# Patient Record
Sex: Male | Born: 1995
Health system: Southern US, Community
[De-identification: ages and names within clinical notes are randomized; demographics above are authoritative.]

---

## 2019-07-20 ENCOUNTER — Observation Stay (HOSPITAL_COMMUNITY)
Admission: EM | Admit: 2019-07-20 | Discharge: 2019-07-20 | Disposition: A | Discharge status: Home / Self Care | Payer: BC Managed Care – PPO | Attending: General Surgery | Admitting: General Surgery

## 2019-07-20 ENCOUNTER — Encounter (HOSPITAL_COMMUNITY): Payer: Self-pay | Admitting: *Deleted

## 2019-07-20 ENCOUNTER — Emergency Department (HOSPITAL_COMMUNITY): Payer: BC Managed Care – PPO | Admitting: Anesthesiology

## 2019-07-20 ENCOUNTER — Emergency Department (HOSPITAL_COMMUNITY): Payer: BC Managed Care – PPO

## 2019-07-20 ENCOUNTER — Other Ambulatory Visit: Payer: Self-pay

## 2019-07-20 ENCOUNTER — Encounter (HOSPITAL_COMMUNITY)
Admission: EM | Disposition: A | Discharge status: Home / Self Care | Payer: Self-pay | Source: Home / Self Care | Attending: Emergency Medicine

## 2019-07-20 DIAGNOSIS — R109 Unspecified abdominal pain: Secondary | ICD-10-CM | POA: Diagnosis present

## 2019-07-20 DIAGNOSIS — F17200 Nicotine dependence, unspecified, uncomplicated: Secondary | ICD-10-CM | POA: Insufficient documentation

## 2019-07-20 DIAGNOSIS — K3580 Unspecified acute appendicitis: Secondary | ICD-10-CM | POA: Diagnosis not present

## 2019-07-20 DIAGNOSIS — K37 Unspecified appendicitis: Secondary | ICD-10-CM | POA: Diagnosis present

## 2019-07-20 DIAGNOSIS — Z20828 Contact with and (suspected) exposure to other viral communicable diseases: Secondary | ICD-10-CM | POA: Diagnosis not present

## 2019-07-20 HISTORY — PX: LAPAROSCOPIC APPENDECTOMY: SHX408

## 2019-07-20 LAB — COMPREHENSIVE METABOLIC PANEL
ALT: 20 U/L (ref 0–44)
AST: 17 U/L (ref 15–41)
Albumin: 4.7 g/dL (ref 3.5–5.0)
Alkaline Phosphatase: 54 U/L (ref 38–126)
Anion gap: 14 (ref 5–15)
BUN: 16 mg/dL (ref 6–20)
CO2: 21 mmol/L — ABNORMAL LOW (ref 22–32)
Calcium: 9.4 mg/dL (ref 8.9–10.3)
Chloride: 103 mmol/L (ref 98–111)
Creatinine, Ser: 0.75 mg/dL (ref 0.61–1.24)
GFR calc Af Amer: >60 mL/min (ref >60)
GFR calc non Af Amer: >60 mL/min (ref >60)
Glucose, Bld: 122 mg/dL — ABNORMAL HIGH (ref 70–99)
Potassium: 3.9 mmol/L (ref 3.5–5.1)
Sodium: 138 mmol/L (ref 135–145)
Total Bilirubin: 1.5 mg/dL — ABNORMAL HIGH (ref 0.3–1.2)
Total Protein: 7.8 g/dL (ref 6.5–8.1)

## 2019-07-20 LAB — URINALYSIS, ROUTINE W REFLEX MICROSCOPIC
Bilirubin Urine: NEGATIVE
Glucose, UA: NEGATIVE mg/dL
Hgb urine dipstick: NEGATIVE
Ketones, ur: 20 mg/dL — AB
Leukocytes,Ua: NEGATIVE
Nitrite: NEGATIVE
Protein, ur: NEGATIVE mg/dL
Specific Gravity, Urine: 1.039 — ABNORMAL HIGH (ref 1.005–1.030)
pH: 6 (ref 5.0–8.0)

## 2019-07-20 LAB — SARS CORONAVIRUS 2 BY RT PCR (HOSPITAL ORDER, PERFORMED IN ~~LOC~~ HOSPITAL LAB): SARS Coronavirus 2: NEGATIVE

## 2019-07-20 LAB — CBC
HCT: 47.2 % (ref 39.0–52.0)
Hemoglobin: 16.1 g/dL (ref 13.0–17.0)
MCH: 28.4 pg (ref 26.0–34.0)
MCHC: 34.1 g/dL (ref 30.0–36.0)
MCV: 83.4 fL (ref 80.0–100.0)
Platelets: 194 10*3/uL (ref 150–400)
RBC: 5.66 MIL/uL (ref 4.22–5.81)
RDW: 13.6 % (ref 11.5–15.5)
WBC: 19.2 10*3/uL — ABNORMAL HIGH (ref 4.0–10.5)
nRBC: 0 % (ref 0.0–0.2)

## 2019-07-20 LAB — LIPASE, BLOOD: Lipase: 33 U/L (ref 11–51)

## 2019-07-20 SURGERY — APPENDECTOMY, LAPAROSCOPIC
Anesthesia: General | Site: Abdomen

## 2019-07-20 MED ORDER — ENOXAPARIN SODIUM 40 MG/0.4ML ~~LOC~~ SOLN: 40.0000 mg | SUBCUTANEOUS | Status: DC

## 2019-07-20 MED ORDER — HYDROCODONE-ACETAMINOPHEN 5-325 MG PO TABS
1.0000 | ORAL_TABLET | ORAL | Status: DC | PRN
  Administered 2019-07-20: 1 via ORAL
  Administered 2019-07-20: 2 via ORAL
  Filled 2019-07-20 (×2): qty 2

## 2019-07-20 MED ORDER — MIDAZOLAM HCL 2 MG/2ML IJ SOLN
INTRAMUSCULAR | Status: AC
  Filled 2019-07-20: qty 2

## 2019-07-20 MED ORDER — FENTANYL CITRATE (PF) 250 MCG/5ML IJ SOLN
INTRAMUSCULAR | Status: AC
  Filled 2019-07-20: qty 5

## 2019-07-20 MED ORDER — SODIUM CHLORIDE 0.9 % IV SOLN
2.0000 g | Freq: Once | INTRAVENOUS | Status: AC
  Administered 2019-07-20: 2 g via INTRAVENOUS
  Filled 2019-07-20: qty 20

## 2019-07-20 MED ORDER — ONDANSETRON HCL 4 MG/2ML IJ SOLN
4.0000 mg | Freq: Once | INTRAMUSCULAR | Status: AC
  Administered 2019-07-20: 4 mg via INTRAVENOUS
  Filled 2019-07-20: qty 2

## 2019-07-20 MED ORDER — DEXAMETHASONE SODIUM PHOSPHATE 10 MG/ML IJ SOLN
INTRAMUSCULAR | Status: AC
  Filled 2019-07-20: qty 1

## 2019-07-20 MED ORDER — FENTANYL CITRATE (PF) 100 MCG/2ML IJ SOLN
50.0000 ug | Freq: Once | INTRAMUSCULAR | Status: AC
  Administered 2019-07-20: 50 ug via INTRAVENOUS
  Filled 2019-07-20: qty 2

## 2019-07-20 MED ORDER — BUPIVACAINE-EPINEPHRINE 0.5% -1:200000 IJ SOLN
INTRAMUSCULAR | Status: DC | PRN
  Administered 2019-07-20: 20 mL

## 2019-07-20 MED ORDER — ROCURONIUM BROMIDE 10 MG/ML (PF) SYRINGE
PREFILLED_SYRINGE | INTRAVENOUS | Status: DC | PRN
  Administered 2019-07-20: 40 mg via INTRAVENOUS
  Administered 2019-07-20: 10 mg via INTRAVENOUS

## 2019-07-20 MED ORDER — DIPHENHYDRAMINE HCL 50 MG/ML IJ SOLN: 25.0000 mg | Freq: Four times a day (QID) | INTRAMUSCULAR | Status: DC | PRN

## 2019-07-20 MED ORDER — BUPIVACAINE-EPINEPHRINE (PF) 0.25% -1:200000 IJ SOLN
INTRAMUSCULAR | Status: AC
  Filled 2019-07-20: qty 30

## 2019-07-20 MED ORDER — MIDAZOLAM HCL 5 MG/5ML IJ SOLN
INTRAMUSCULAR | Status: DC | PRN
  Administered 2019-07-20: 2 mg via INTRAVENOUS

## 2019-07-20 MED ORDER — BISACODYL 10 MG RE SUPP: 10.0000 mg | Freq: Every day | RECTAL | Status: DC | PRN

## 2019-07-20 MED ORDER — DIPHENHYDRAMINE HCL 25 MG PO CAPS: 25.0000 mg | ORAL_CAPSULE | Freq: Four times a day (QID) | ORAL | Status: DC | PRN

## 2019-07-20 MED ORDER — SODIUM CHLORIDE 0.9 % IV SOLN
INTRAVENOUS | Status: DC | PRN
  Administered 2019-07-20: 12:00:00 via INTRAVENOUS

## 2019-07-20 MED ORDER — MORPHINE SULFATE (PF) 4 MG/ML IV SOLN
4.0000 mg | Freq: Once | INTRAVENOUS | Status: AC
  Administered 2019-07-20: 09:00:00 4 mg via INTRAVENOUS
  Filled 2019-07-20: qty 1

## 2019-07-20 MED ORDER — SENNOSIDES-DOCUSATE SODIUM 8.6-50 MG PO TABS: 1.0000 | ORAL_TABLET | Freq: Every day | ORAL | Status: DC

## 2019-07-20 MED ORDER — ONDANSETRON HCL 4 MG/2ML IJ SOLN
INTRAMUSCULAR | Status: DC | PRN
  Administered 2019-07-20: 4 mg via INTRAVENOUS

## 2019-07-20 MED ORDER — ONDANSETRON HCL 4 MG/2ML IJ SOLN: 4.0000 mg | Freq: Four times a day (QID) | INTRAMUSCULAR | Status: DC | PRN

## 2019-07-20 MED ORDER — IOHEXOL 300 MG/ML  SOLN
100.0000 mL | Freq: Once | INTRAMUSCULAR | Status: AC | PRN
  Administered 2019-07-20: 100 mL via INTRAVENOUS

## 2019-07-20 MED ORDER — FENTANYL CITRATE (PF) 100 MCG/2ML IJ SOLN
INTRAMUSCULAR | Status: DC | PRN
  Administered 2019-07-20: 50 ug via INTRAVENOUS
  Administered 2019-07-20 (×2): 100 ug via INTRAVENOUS

## 2019-07-20 MED ORDER — SODIUM CHLORIDE 0.9% FLUSH
3.0000 mL | Freq: Once | INTRAVENOUS | Status: AC
  Administered 2019-07-20: 08:00:00 3 mL via INTRAVENOUS

## 2019-07-20 MED ORDER — ONDANSETRON 4 MG PO TBDP: 4.0000 mg | ORAL_TABLET | Freq: Four times a day (QID) | ORAL | Status: DC | PRN

## 2019-07-20 MED ORDER — LIDOCAINE 2% (20 MG/ML) 5 ML SYRINGE
INTRAMUSCULAR | Status: AC
  Filled 2019-07-20: qty 5

## 2019-07-20 MED ORDER — LACTATED RINGERS IR SOLN
Status: DC | PRN
  Administered 2019-07-20: 1000 mL

## 2019-07-20 MED ORDER — DEXAMETHASONE SODIUM PHOSPHATE 10 MG/ML IJ SOLN
INTRAMUSCULAR | Status: DC | PRN
  Administered 2019-07-20: 10 mg via INTRAVENOUS

## 2019-07-20 MED ORDER — MORPHINE SULFATE (PF) 2 MG/ML IV SOLN: 2.0000 mg | INTRAVENOUS | Status: DC | PRN

## 2019-07-20 MED ORDER — 0.9 % SODIUM CHLORIDE (POUR BTL) OPTIME
TOPICAL | Status: DC | PRN
  Administered 2019-07-20: 1000 mL

## 2019-07-20 MED ORDER — SUGAMMADEX SODIUM 200 MG/2ML IV SOLN
INTRAVENOUS | Status: DC | PRN
  Administered 2019-07-20: 200 mg via INTRAVENOUS

## 2019-07-20 MED ORDER — ACETAMINOPHEN 500 MG PO TABS
1000.0000 mg | ORAL_TABLET | Freq: Four times a day (QID) | ORAL | Status: DC
  Administered 2019-07-20: 1000 mg via ORAL
  Filled 2019-07-20 (×2): qty 2

## 2019-07-20 MED ORDER — PROPOFOL 10 MG/ML IV BOLUS
INTRAVENOUS | Status: AC
  Filled 2019-07-20: qty 20

## 2019-07-20 MED ORDER — METRONIDAZOLE IN NACL 5-0.79 MG/ML-% IV SOLN
500.0000 mg | Freq: Once | INTRAVENOUS | Status: AC
  Administered 2019-07-20: 500 mg via INTRAVENOUS
  Filled 2019-07-20: qty 100

## 2019-07-20 MED ORDER — KCL IN DEXTROSE-NACL 20-5-0.45 MEQ/L-%-% IV SOLN
INTRAVENOUS | Status: DC
  Administered 2019-07-20: 16:00:00 via INTRAVENOUS
  Filled 2019-07-20: qty 1000

## 2019-07-20 MED ORDER — SODIUM CHLORIDE 0.9 % IV BOLUS
1000.0000 mL | Freq: Once | INTRAVENOUS | Status: AC
  Administered 2019-07-20: 1000 mL via INTRAVENOUS

## 2019-07-20 MED ORDER — HYDROCODONE-ACETAMINOPHEN 5-325 MG PO TABS: 1.0000 | ORAL_TABLET | Freq: Four times a day (QID) | ORAL | 0 refills | Status: AC | PRN

## 2019-07-20 MED ORDER — ONDANSETRON HCL 4 MG/2ML IJ SOLN
INTRAMUSCULAR | Status: AC
  Filled 2019-07-20: qty 2

## 2019-07-20 MED ORDER — SUCCINYLCHOLINE CHLORIDE 200 MG/10ML IV SOSY
PREFILLED_SYRINGE | INTRAVENOUS | Status: AC
  Filled 2019-07-20: qty 10

## 2019-07-20 MED ORDER — SIMETHICONE 80 MG PO CHEW: 40.0000 mg | CHEWABLE_TABLET | Freq: Four times a day (QID) | ORAL | Status: DC | PRN

## 2019-07-20 MED ORDER — ROCURONIUM BROMIDE 10 MG/ML (PF) SYRINGE
PREFILLED_SYRINGE | INTRAVENOUS | Status: AC
  Filled 2019-07-20: qty 10

## 2019-07-20 MED ORDER — SODIUM CHLORIDE (PF) 0.9 % IJ SOLN
INTRAMUSCULAR | Status: AC
  Administered 2019-07-20: 09:00:00
  Filled 2019-07-20: qty 50

## 2019-07-20 SURGICAL SUPPLY — 45 items
APPLIER CLIP 5 13 M/L LIGAMAX5 (MISCELLANEOUS)
APPLIER CLIP ROT 10 11.4 M/L (STAPLE)
CABLE HIGH FREQUENCY MONO STRZ (ELECTRODE) ×2 IMPLANT
CHLORAPREP W/TINT 26 (MISCELLANEOUS) ×2 IMPLANT
CLIP APPLIE 5 13 M/L LIGAMAX5 (MISCELLANEOUS) IMPLANT
CLIP APPLIE ROT 10 11.4 M/L (STAPLE) IMPLANT
COVER WAND RF STERILE (DRAPES) IMPLANT
CUTTER FLEX LINEAR 45M (STAPLE) ×1 IMPLANT
DECANTER SPIKE VIAL GLASS SM (MISCELLANEOUS) ×2 IMPLANT
DERMABOND ADVANCED (GAUZE/BANDAGES/DRESSINGS) ×1
DERMABOND ADVANCED .7 DNX12 (GAUZE/BANDAGES/DRESSINGS) ×1 IMPLANT
DRAPE LAPAROSCOPIC ABDOMINAL (DRAPES) IMPLANT
ELECT REM PT RETURN 15FT ADLT (MISCELLANEOUS) ×2 IMPLANT
GLOVE BIO SURGEON STRL SZ 6.5 (GLOVE) ×2 IMPLANT
GLOVE BIOGEL PI IND STRL 7.0 (GLOVE) ×1 IMPLANT
GLOVE BIOGEL PI INDICATOR 7.0 (GLOVE) ×1
GOWN STRL REUS W/TWL 2XL LVL3 (GOWN DISPOSABLE) ×2 IMPLANT
GOWN STRL REUS W/TWL XL LVL3 (GOWN DISPOSABLE) ×2 IMPLANT
GRASPER SUT TROCAR 14GX15 (MISCELLANEOUS) IMPLANT
HANDLE STAPLE EGIA 4 XL (STAPLE) IMPLANT
IRRIG SUCT STRYKERFLOW 2 WTIP (MISCELLANEOUS) ×2
IRRIGATION SUCT STRKRFLW 2 WTP (MISCELLANEOUS) ×1 IMPLANT
KIT BASIN OR (CUSTOM PROCEDURE TRAY) ×2 IMPLANT
KIT TURNOVER KIT A (KITS) IMPLANT
MARKER SKIN DUAL TIP RULER LAB (MISCELLANEOUS) IMPLANT
POUCH SPECIMEN RETRIEVAL 10MM (ENDOMECHANICALS) ×1 IMPLANT
RELOAD EGIA 45 MED/THCK PURPLE (STAPLE) IMPLANT
RELOAD EGIA 45 TAN VASC (STAPLE) IMPLANT
RELOAD EGIA 60 MED/THCK PURPLE (STAPLE) IMPLANT
RELOAD EGIA 60 TAN VASC (STAPLE) IMPLANT
RELOAD STAPLE 45 3.5 BLU ETS (ENDOMECHANICALS) IMPLANT
RELOAD STAPLE 60 MED/THCK ART (STAPLE) IMPLANT
RELOAD STAPLE TA45 3.5 REG BLU (ENDOMECHANICALS) ×2 IMPLANT
SCISSORS LAP 5X35 DISP (ENDOMECHANICALS) IMPLANT
SHEARS HARMONIC ACE PLUS 36CM (ENDOMECHANICALS) ×1 IMPLANT
SLEEVE XCEL OPT CAN 5 100 (ENDOMECHANICALS) ×2 IMPLANT
SUT VIC AB 2-0 SH 27 (SUTURE)
SUT VIC AB 2-0 SH 27X BRD (SUTURE) IMPLANT
SUT VIC AB 4-0 PS2 27 (SUTURE) ×2 IMPLANT
SUT VICRYL 0 UR6 27IN ABS (SUTURE) IMPLANT
TOWEL OR 17X26 10 PK STRL BLUE (TOWEL DISPOSABLE) ×2 IMPLANT
TRAY FOLEY MTR SLVR 16FR STAT (SET/KITS/TRAYS/PACK) ×2 IMPLANT
TRAY LAPAROSCOPIC (CUSTOM PROCEDURE TRAY) ×2 IMPLANT
TROCAR BLADELESS OPT 5 100 (ENDOMECHANICALS) ×1 IMPLANT
TROCAR XCEL BLUNT TIP 100MML (ENDOMECHANICALS) ×2 IMPLANT

## 2019-07-20 NOTE — Progress Notes (Signed)
Unable to realease PACU orders despite multiple tries. LR hung prior to discharge from PACU per Dr. Smith Robert.

## 2019-07-20 NOTE — Plan of Care (Signed)
Patient received from PACU via stretcher; drowsy but arousable. Responds appropriately to questions. Ambulated from bed to bathroom; voided, and returned to bed. No needs expressed at this time. Will continue to monitor.

## 2019-07-20 NOTE — ED Triage Notes (Signed)
Abd pain all over last night with some vomiting, states he feels bloated and constipated.

## 2019-07-20 NOTE — ED Provider Notes (Signed)
[facility]-EMERGENCY DEPT Provider Note   CSN: [id number] Arrival date & time: 07/20/19  0707     History   Chief Complaint Chief Complaint  Patient presents with   Abdominal Pain    HPI Brandon Reynolds is a 23 y.o. male with a hx of tobacco abuse who presents to the ED with complaints of abdominal pain that began at 22:00 last night. Patient states pain is mostly a generalized dull discomfort that has been progressively worsening, he does however get sharp pains to the RLQ intermittently with coughing & certain movements. No other alleviating/aggravating factors. Has had associated 3 episodes of non bloody non bilious emesis. Thought it was constipation as he has not had a BM since 08/21 which is atypical for him therefore he tried what he believes was miralax 2 hours PTA without relief. He has had some chills. Denies fever, hematemesis, diarrhea, melena, hematochezia, dysuria, or testicular pain/swelling. No prior abdominal surgeries. Last PO intake was around 1900 last evening.      HPI  History reviewed. No pertinent past medical history.  There are no active problems to display for this patient.   History reviewed. No pertinent surgical history.      Home Medications    Prior to Admission medications   Not on File    Family History No family history on file.  Social History Social History   Tobacco Use   Smoking status: Current Some Day Smoker   Smokeless tobacco: Never Used  Substance Use Topics   Alcohol use: Yes   Drug use: Yes    Types: Marijuana     Allergies   Patient has no known allergies.   Review of Systems Review of Systems  Constitutional: Positive for chills. Negative for fever.  Respiratory: Negative for shortness of breath.   Cardiovascular: Negative for chest pain.  Gastrointestinal: Positive for abdominal pain, constipation and vomiting. Negative for anal bleeding, blood in stool and diarrhea.    Genitourinary: Negative for dysuria, scrotal swelling and testicular pain.  Neurological: Negative for syncope.  All other systems reviewed and are negative.    Physical Exam Updated Vital Signs BP 134/77 (BP Location: Left Arm)    Pulse 60    Temp 98.5 F (36.9 C) (Oral)    Resp 16  SpO2 98%  Physical Exam Vitals signs and nursing note reviewed.  Constitutional:      General: He is not in acute distress.    Appearance: He is well-developed. He is not toxic-appearing.  HENT:     Head: Normocephalic and atraumatic.  Eyes:     General:        Right eye: No discharge.        Left eye: No discharge.     Conjunctiva/sclera: Conjunctivae normal.  Neck:     Musculoskeletal: Neck supple.  Cardiovascular:     Rate and Rhythm: Normal rate and regular rhythm.  Pulmonary:     Effort: Pulmonary effort is normal. No respiratory distress.     Breath sounds: Normal breath sounds. No wheezing, rhonchi or rales.  Abdominal:     General: There is no distension.     Palpations: Abdomen is soft.     Tenderness: There is abdominal tenderness in the right upper quadrant and right lower quadrant. There is no right CVA tenderness, left CVA tenderness, guarding or rebound. Positive signs include McBurney's sign. Negative signs include Murphy's sign, Rovsing's sign, psoas sign and obturator sign.  Skin:  General: Skin is warm and dry.     Findings: No rash.  Neurological:     Mental Status: He is alert.     Comments: Clear speech.   Psychiatric:        Behavior: Behavior normal.    ED Treatments / Results  Labs (all labs ordered are listed, but only abnormal results are displayed) Labs Reviewed  COMPREHENSIVE METABOLIC PANEL - Abnormal; Notable for the following components:      Result Value   CO2 21 (*)    Glucose, Bld 122 (*)    Total Bilirubin 1.5 (*)    All other components within normal limits  CBC - Abnormal; Notable for the following components:   WBC 19.2 (*)    All other  components within normal limits  URINALYSIS, ROUTINE W REFLEX MICROSCOPIC - Abnormal; Notable for the following components:   Specific Gravity, Urine 1.039 (*)    Ketones, ur 20 (*)    All other components within normal limits  SARS CORONAVIRUS 2 (HOSPITAL ORDER, PERFORMED IN [facility])  LIPASE, BLOOD    EKG None  Radiology Ct Abdomen Pelvis W Contrast  Result Date: 07/20/2019 CLINICAL DATA:  Generalized abdominal pain worse on the right side and vomiting. EXAM: CT ABDOMEN AND PELVIS WITH CONTRAST TECHNIQUE: Multidetector CT imaging of the abdomen and pelvis was performed using the standard protocol following bolus administration of intravenous contrast. CONTRAST:  100mL OMNIPAQUE IOHEXOL 300 MG/ML  SOLN COMPARISON:  None. FINDINGS: Lower chest: No acute abnormality. Hepatobiliary: No focal liver abnormality is seen. No gallstones, gallbladder wall thickening, or biliary dilatation. Pancreas: Unremarkable. No pancreatic ductal dilatation or surrounding inflammatory changes. Spleen: Normal in size without focal abnormality. Adrenals/Urinary Tract: Adrenal glands are unremarkable. Kidneys are normal, without renal calculi, focal lesion, or hydronephrosis. Bladder is unremarkable. Stomach/Bowel: There is an elongated inflamed structure that emanates likely off of the distal cecum and ascends superiorly up towards the liver, nearly contacting the undersurface of the liver. The structure is candy cane in shape and the distal portion is dilated and inflamed, measuring up to 10 mm. There is significant edema surrounding the distal portion of the structure with some probable surrounding focal fluid but no discrete abscess or extraluminal air. Findings are most suggestive of acute appendicitis. An inflamed Meckel's diverticulum is felt to be less likely. Inflammation surrounding the structure also abuts the anterior aspect of the right mid to lower kidney and the posterior right duodenum.  Vascular/Lymphatic: No significant vascular findings are present. No enlarged abdominal or pelvic lymph nodes. Reproductive: Prostate is unremarkable. Other: No abdominal wall hernia or abnormality. No abdominopelvic ascites. Musculoskeletal: No acute or significant osseous findings. IMPRESSION: Findings most likely consistent with acute appendicitis with a very elongated ascending appendix present extending up to the undersurface of the liver, abutting the anterior kidney and the posterior duodenum. Distal appendix measures approximately 10 mm. There is significant inflammation and periappendiceal fluid along the distal aspect of the appendix and focal perforation is not excluded. There is no evidence of discrete abscess or extraluminal air. Electronically Signed   By: Irish LackGlenn  Yamagata M.D.   On: 07/20/2019 09:30    Procedures Procedures (including critical care time)  Medications Ordered in ED Medications  sodium chloride flush (NS) 0.9 % injection 3 mL (has no administration in time range)     Initial Impression / Assessment and Plan / ED Course  I have reviewed the triage vital signs and the nursing notes.  Pertinent labs &  imaging results that were available during my care of the patient were reviewed by me and considered in my medical decision making (see chart for details).   Patient is an otherwise healthy 23 yo male who presents to the ED w/ abdominal pain & associated emesis & constipation. Nontoxic appearing, vitals WNL. Exam w/ RUQ/RLQ abdominal tenderness w/o peritoneal signs, does have tenderness over Mcburneys point, negative murphys. DDX: appendicitis, cholecystitis, viral Gil illness, obstruction, perf, constipation. Proceed w/ labs, CT imaging. Analgesics, anti-emetics, & fluids ordered.   CBC: Leukocytosis @ 19.2. No anemia.  CMP: LFTs & renal function WNL. No significant electrolyte derangment.  Lipase: WNL UA: Dehydration w/ elevated specific gravity & ketonuria- fluids  being administered.   CT A/P: Consistent w/ acute appendicitis, focal perf not excluded, no evidence of discreen abscess or extraluminal air.   Abx started. Consult to general surgery. Patient updated on results & plan of care.   10:04: CONSULT: Discussed case with general surgeon Dr. Marcello Moores- will evaluate patient.   Patient off the floor, care assumed by surgical team with plan for appendectomy.   Final Clinical Impressions(s) / ED Diagnoses   Final diagnoses:  Acute appendicitis, unspecified acute appendicitis type    ED Discharge Orders    None       Amaryllis Dyke, PA-C 07/20/19 1145    Virgel Manifold, MD 07/20/19 1211

## 2019-07-20 NOTE — Discharge Instructions (Signed)
LAPAROSCOPIC SURGERY: POST OP INSTRUCTIONS ° °1. DIET: Follow a light bland diet the first 24 hours after arrival home, such as soup, liquids, crackers, etc.  Be sure to include lots of fluids daily.  Avoid fast food or heavy meals as your are more likely to get nauseated.  Eat a low fat the next few days after surgery.   °2. Take your usually prescribed home medications unless otherwise directed. °3. PAIN CONTROL: °a. Pain is best controlled by a usual combination of three different methods TOGETHER: °i. Ice/Heat °ii. Over the counter pain medication °iii. Prescription pain medication °b. Most patients will experience some swelling and bruising around the incisions.  Ice packs or heating pads (30-60 minutes up to 6 times a day) will help. Use ice for the first few days to help decrease swelling and bruising, then switch to heat to help relax tight/sore spots and speed recovery.  Some people prefer to use ice alone, heat alone, alternating between ice & heat.  Experiment to what works for you.  Swelling and bruising can take several weeks to resolve.   °c. It is helpful to take an over-the-counter pain medication regularly for the first few weeks.  Choose one of the following that works best for you: °i. Naproxen (Aleve, etc)  Two 220mg tabs twice a day °ii. Ibuprofen (Advil, etc) Three 200mg tabs four times a day (every meal & bedtime) °d. A  prescription for pain medication (such as percocet, vicodin, oxycodone, hydrocodone, etc) should be given to you upon discharge.  Take your pain medication as prescribed.  °i. If you are having problems/concerns with the prescription medicine (does not control pain, nausea, vomiting, rash, itching, etc), please call us [phone] to see if we need to switch you to a different pain medicine that will work better for you and/or control your side effect better. °ii. If you need a refill on your pain medication, please contact your pharmacy.  They will contact our office to  request authorization. Prescriptions will not be filled after 5 pm or on week-ends. ° ° °4. Avoid getting constipated.  Between the surgery and the pain medications, it is common to experience some constipation.  Increasing fluid intake and taking a fiber supplement (such as Metamucil, Citrucel, FiberCon, MiraLax, etc) 1-2 times a day regularly will usually help prevent this problem from occurring.  A mild laxative (prune juice, Milk of Magnesia, MiraLax, etc) should be taken according to package directions if there are no bowel movements after 48 hours.   °5. Watch out for diarrhea.  If you have many loose bowel movements, simplify your diet to bland foods & liquids for a few days.  Stop any stool softeners and decrease your fiber supplement.  Switching to mild anti-diarrheal medications (Kayopectate, Pepto Bismol) can help.  If this worsens or does not improve, please call us. °6. Wash / shower every day.  You may shower over the dressings as they are waterproof.  Continue to shower over incision(s) after the dressing is off. °7. Remove your waterproof bandages 5 days after surgery.  You may leave the incision open to air.  You may replace a dressing/Band-Aid to cover the incision for comfort if you wish.  °8. ACTIVITIES as tolerated:   °a. You may resume regular (light) daily activities beginning the next day--such as daily self-care, walking, climbing stairs--gradually increasing activities as tolerated.  If you can walk 30 minutes without difficulty, it is safe to try more intense activity such as   jogging, treadmill, bicycling, low-impact aerobics, swimming, etc. b. NO HEAVY LIFTING FOR 4 WEEKS AFTER SURGERY. c. Save the most intensive and strenuous activity for last such as sit-ups, heavy lifting, contact sports, etc  Refrain from any heavy lifting or straining until you are off narcotics for pain control.   d. DO NOT PUSH THROUGH PAIN.  Let pain be your guide: If it hurts to do something, don't do it.   Pain is your body warning you to avoid that activity for another week until the pain goes down. e. You may drive when you are no longer taking prescription pain medication, you can comfortably wear a seatbelt, and you can safely maneuver your car and apply brakes. f. You may have sexual intercourse when it is comfortable.  g. YOU MAY RETURN TO WORK IN 1 WEEK.  If you need a not for work, please contact our office. 9. FOLLOW UP in our office a. Please call [facility] at [phone] to set up an appointment to see your surgeon in the office for a follow-up appointment approximately 2-3 weeks after your surgery. b. Make sure that you call for this appointment the day you arrive home to insure a convenient appointment time. 10. IF YOU HAVE DISABILITY OR FAMILY LEAVE FORMS, BRING THEM TO THE OFFICE FOR PROCESSING.  DO NOT GIVE THEM TO YOUR DOCTOR.   WHEN TO CALL us [phone]: 1. Poor pain control 2. Reactions / problems with new medications (rash/itching, nausea, etc)  3. Fever over 101.5 F (38.5 C) 4. Inability to urinate 5. Nausea and/or vomiting 6. Worsening swelling or bruising 7. Continued bleeding from incision. 8. Increased pain, redness, or drainage from the incision   The clinic staff is available to answer your questions during regular business hours (8:30am-5pm).  Please dont hesitate to call and ask to speak to one of our nurses for clinical concerns.   If you have a medical emergency, go to the nearest emergency room or call 911.  A surgeon from [facility] is always on call at the [facility], [place], [place], [place], [place]  [address] ? MAIN: [phone] ? TOLL FREE: [phone] ?  FAX [phone] [email or web address]

## 2019-07-20 NOTE — Progress Notes (Signed)
Pt disharged with father at main entrance. Accompanied by nurse tech.

## 2019-07-20 NOTE — Anesthesia Preprocedure Evaluation (Addendum)
Anesthesia Evaluation  Patient identified by MRN, date of birth, ID band Patient awake    Reviewed: Allergy & Precautions, NPO status , Patient's Chart, lab work & pertinent test results  Airway Mallampati: I  TM Distance: >3 FB Neck ROM: Full    Dental  (+) Teeth Intact, Dental Advisory Given   Pulmonary Current Smoker and Patient abstained from smoking.,    breath sounds clear to auscultation       Cardiovascular negative cardio ROS   Rhythm:Regular Rate:Normal     Neuro/Psych negative neurological ROS  negative psych ROS   GI/Hepatic negative GI ROS, Neg liver ROS,   Endo/Other  negative endocrine ROS  Renal/GU negative Renal ROS     Musculoskeletal negative musculoskeletal ROS (+)   Abdominal Normal abdominal exam  (+)   Peds  Hematology negative hematology ROS (+)   Anesthesia Other Findings   Reproductive/Obstetrics                            Anesthesia Physical Anesthesia Plan  ASA: II and emergent  Anesthesia Plan: General   Post-op Pain Management:    Induction: Intravenous, Rapid sequence and Cricoid pressure planned  PONV Risk Score and Plan: 2 and Ondansetron, Dexamethasone and Midazolam  Airway Management Planned: Oral ETT  Additional Equipment: None  Intra-op Plan:   Post-operative Plan: Extubation in OR  Informed Consent: I have reviewed the patients History and Physical, chart, labs and discussed the procedure including the risks, benefits and alternatives for the proposed anesthesia with the patient or authorized representative who has indicated his/her understanding and acceptance.     Dental advisory given  Plan Discussed with: CRNA  Anesthesia Plan Comments:        Anesthesia Quick Evaluation

## 2019-07-20 NOTE — Transfer of Care (Signed)
Immediate Anesthesia Transfer of Care Note  Patient: Brandon Reynolds  Procedure(s) Performed: APPENDECTOMY LAPAROSCOPIC (N/A Abdomen)  Patient Location: PACU  Anesthesia Type:General  Level of Consciousness: awake, alert  and oriented  Airway & Oxygen Therapy: Patient Spontanous Breathing and Patient connected to nasal cannula oxygen  Post-op Assessment: Report given to RN and Post -op Vital signs reviewed and stable  Post vital signs: Reviewed and stable  Last Vitals:  Vitals Value Taken Time  BP    Temp    Pulse 69 07/20/19 1312  Resp    SpO2 97 % 07/20/19 1312  Vitals shown include unvalidated device data.  Last Pain:  Vitals:   07/20/19 1056  TempSrc: Oral  PainSc: 6          Complications: No apparent anesthesia complications

## 2019-07-20 NOTE — ED Notes (Addendum)
Pt's belongings (clothes, cell, wallet) placed in [facility] locker [address].

## 2019-07-20 NOTE — ED Notes (Signed)
Patient transported to CT 

## 2019-07-20 NOTE — ED Notes (Signed)
Orange tube COVID test sent to Lab. Report given to Legrand Como, RN. Pt being transported to OR.

## 2019-07-20 NOTE — Anesthesia Procedure Notes (Signed)
Procedure Name: Intubation Date/Time: 07/20/2019 11:59 AM Performed by: Keng Jewel D, CRNA Pre-anesthesia Checklist: Patient identified, Emergency Drugs available, Suction available and Patient being monitored Patient Re-evaluated:Patient Re-evaluated prior to induction Oxygen Delivery Method: Circle system utilized Preoxygenation: Pre-oxygenation with 100% oxygen Induction Type: IV induction and Rapid sequence Laryngoscope Size: Mac and 4 Grade View: Grade I Tube type: Oral Tube size: 7.5 mm Number of attempts: 1 Airway Equipment and Method: Stylet Placement Confirmation: ETT inserted through vocal cords under direct vision,  positive ETCO2 and breath sounds checked- equal and bilateral Secured at: 22 cm Tube secured with: Tape Dental Injury: Teeth and Oropharynx as per pre-operative assessment

## 2019-07-20 NOTE — ED Notes (Signed)
Pt. Aware of urine specimen. Urinal at the bedside. Will collect urine when pt. Voids. Nurse aware. 

## 2019-07-20 NOTE — Op Note (Signed)
Brandon Reynolds [id number]   PRE-OPERATIVE DIAGNOSIS:  appendicitis  POST-OPERATIVE DIAGNOSIS:  Acute appendicitis   Procedure(s): APPENDECTOMY LAPAROSCOPIC  SURGEON:  Surgeon(s): Leighton Ruff, MD  ASSISTANT: none   ANESTHESIA:   local and general  EBL:   64ml   Delay start of Pharmacological VTE agent (>24hrs) due to surgical blood loss or risk of bleeding:  no  DRAINS: none   SPECIMEN:  Source of Specimen:  appendix  DISPOSITION OF SPECIMEN:  PATHOLOGY  COUNTS:  YES  PLAN OF CARE: Admit for overnight observation  PATIENT DISPOSITION:  PACU - hemodynamically stable.   INDICATIONS: Patient with concerning symptoms & work up suspicious for appendicitis.  Surgery was recommended:  The anatomy & physiology of the digestive tract was discussed.  The pathophysiology of appendicitis was discussed.  Natural history risks without surgery was discussed.   I feel the risks of no intervention will lead to serious problems that outweigh the operative risks; therefore, I recommended diagnostic laparoscopy with removal of appendix to remove the pathology.  Laparoscopic & open techniques were discussed.   I noted a good likelihood this will help address the problem.    Risks such as bleeding, infection, abscess, leak, reoperation, possible ostomy, hernia, heart attack, death, and other risks were discussed.  Goals of post-operative recovery were discussed as well.  We will work to minimize complications.  Questions were answered.  The patient expresses understanding & wishes to proceed with surgery.  OR FINDINGS: Elongated retrocecal appendix with inflammation at the tip.  The tip of the appendix overlies the C-loop of the duodenum.  DESCRIPTION:   The patient was identified & brought into the operating room. The patient was positioned supine with left arm tucked. SCDs were active during the entire case. The patient underwent general anesthesia without any difficulty.  A foley  catheter was inserted under sterile conditions. The abdomen was prepped and draped in a sterile fashion. A Surgical Timeout confirmed our plan.   I made a transverse incision through the inferior umbilical fold.  I made a nick in the infraumbilical fascia and confirmed peritoneal entry.  I placed a stay suture and then the [facility] port.  We induced carbon dioxide insufflation.  Camera inspection revealed no injury.  I placed additional ports under direct laparoscopic visualization.  The patient had a retrocecal appendix.  I mobilized the terminal ileum to the hepatic flexure in a lateral to medial fashion.  I took care to avoid injuring any retroperitoneal structures.   I freed the appendix off its attachments to the ascending colon and cecal mesentery using a harmonic scalpel.  The appendix reached all the way to the liver.  The tip of the appendix was inflamed.  I elevated the appendix and divided all the mesoappendix back to the cecum using the harmonic scalpel making sure to avoid injury to the colon itself.  I was able to free off the base of the appendix, which was still viable.  I stapled the appendix off the cecum using a Ethicon laparoscopic blue load stapler.  I took a healthy cuff viable cecum. I placed the appendix inside an EndoCatch bag and removed out the Worden port.  I did copious irrigation. Hemostasis was good in the mesoappendix, colon mesentery, and retroperitoneum. Staple line was intact on the cecum with no bleeding. I washed out the pelvis, retrohepatic space and right paracolic gutter.  Hemostasis is good. There was no perforation or injury.  Because the area cleaned up well after irrigation,  I did not place a drain.  I aspirated the carbon dioxide. I removed the ports. I closed the umbilical fascia site using a 0 Vicryl stitch. I closed skin using 4-0 vicryl stitch.  Sterile dressings were applied.  Patient was extubated and sent to the recovery room.  I discussed the operative  findings with the patient's family. I suspect the patient is going used in the hospital at least overnight and will need antibiotics for no more days. Questions answered. They expressed understanding and appreciation.

## 2019-07-20 NOTE — H&P (Addendum)
Brandon ButteJefferson J Reynolds is an 23 y.o. male.   Chief Complaint: RLQ pain HPI: 23 y.o. M with abd pain since last night, also with nausea and vomiting.  C/o constipation.  No fevers.  Pain locates to RLQ  History reviewed. No pertinent past medical history.  History reviewed. No pertinent surgical history.  History reviewed. No pertinent family history. Social History:  reports that he has been smoking. He has never used smokeless tobacco. He reports current alcohol use. He reports current drug use. Drug: Marijuana.  Allergies: No Known Allergies  (Not in a hospital admission)   Results for orders placed or performed during the hospital encounter of 07/20/19 (from the past 48 hour(s))  Urinalysis, Routine w reflex microscopic     Status: Abnormal   Collection Time: 07/20/19  7:20 AM  Result Value Ref Range   Color, Urine YELLOW YELLOW   APPearance CLEAR CLEAR   Specific Gravity, Urine 1.039 (H) 1.005 - 1.030   pH 6.0 5.0 - 8.0   Glucose, UA NEGATIVE NEGATIVE mg/dL   Hgb urine dipstick NEGATIVE NEGATIVE   Bilirubin Urine NEGATIVE NEGATIVE   Ketones, ur 20 (A) NEGATIVE mg/dL   Protein, ur NEGATIVE NEGATIVE mg/dL   Nitrite NEGATIVE NEGATIVE   Leukocytes,Ua NEGATIVE NEGATIVE    Comment: Performed at [facility], [address] [place], [place] [address]  Lipase, blood     Status: None   Collection Time: 07/20/19  7:56 AM  Result Value Ref Range   Lipase 33 11 - 51 U/L    Comment: Performed at [facility], [address] [place], [place] [address]  Comprehensive metabolic panel     Status: Abnormal   Collection Time: 07/20/19  7:56 AM  Result Value Ref Range   Sodium 138 135 - 145 mmol/L   Potassium 3.9 3.5 - 5.1 mmol/L   Chloride 103 98 - 111 mmol/L   CO2 21 (L) 22 - 32 mmol/L   Glucose, Bld 122 (H) 70 - 99 mg/dL   BUN 16 6 - 20 mg/dL   Creatinine, Ser 0.980.75 0.61 - 1.24 mg/dL   Calcium 9.4 8.9 - 11.910.3 mg/dL   Total Protein 7.8 6.5 - 8.1 g/dL    Albumin 4.7 3.5 - 5.0 g/dL   AST 17 15 - 41 U/L   ALT 20 0 - 44 U/L   Alkaline Phosphatase 54 38 - 126 U/L   Total Bilirubin 1.5 (H) 0.3 - 1.2 mg/dL   GFR calc non Af Amer >60 >60 mL/min   GFR calc Af Amer >60 >60 mL/min   Anion gap 14 5 - 15    Comment: Performed at [facility], [address] [place], [place] [address]  CBC     Status: Abnormal   Collection Time: 07/20/19  7:56 AM  Result Value Ref Range   WBC 19.2 (H) 4.0 - 10.5 K/uL   RBC 5.66 4.22 - 5.81 MIL/uL   Hemoglobin 16.1 13.0 - 17.0 g/dL   HCT 95.647.2 21.339.0 - 08.652.0 %   MCV 83.4 80.0 - 100.0 fL   MCH 28.4 26.0 - 34.0 pg   MCHC 34.1 30.0 - 36.0 g/dL   RDW 57.813.6 46.911.5 - 62.915.5 %   Platelets 194 150 - 400 K/uL   nRBC 0.0 0.0 - 0.2 %    Comment: Performed at [facility], [address] [place], [place] [address]   Ct Abdomen Pelvis W Contrast  Result Date: 07/20/2019 CLINICAL  DATA:  Generalized abdominal pain worse on the right side and vomiting. EXAM: CT ABDOMEN AND PELVIS WITH CONTRAST TECHNIQUE: Multidetector CT imaging of the abdomen and pelvis was performed using the standard protocol following bolus administration of intravenous contrast. CONTRAST:  157mL OMNIPAQUE IOHEXOL 300 MG/ML  SOLN COMPARISON:  None. FINDINGS: Lower chest: No acute abnormality. Hepatobiliary: No focal liver abnormality is seen. No gallstones, gallbladder wall thickening, or biliary dilatation. Pancreas: Unremarkable. No pancreatic ductal dilatation or surrounding inflammatory changes. Spleen: Normal in size without focal abnormality. Adrenals/Urinary Tract: Adrenal glands are unremarkable. Kidneys are normal, without renal calculi, focal lesion, or hydronephrosis. Bladder is unremarkable. Stomach/Bowel: There is an elongated inflamed structure that emanates likely off of the distal cecum and ascends superiorly up towards the liver, nearly contacting the undersurface of the liver. The structure is candy cane in shape  and the distal portion is dilated and inflamed, measuring up to 10 mm. There is significant edema surrounding the distal portion of the structure with some probable surrounding focal fluid but no discrete abscess or extraluminal air. Findings are most suggestive of acute appendicitis. An inflamed Meckel's diverticulum is felt to be less likely. Inflammation surrounding the structure also abuts the anterior aspect of the right mid to lower kidney and the posterior right duodenum. Vascular/Lymphatic: No significant vascular findings are present. No enlarged abdominal or pelvic lymph nodes. Reproductive: Prostate is unremarkable. Other: No abdominal wall hernia or abnormality. No abdominopelvic ascites. Musculoskeletal: No acute or significant osseous findings. IMPRESSION: Findings most likely consistent with acute appendicitis with a very elongated ascending appendix present extending up to the undersurface of the liver, abutting the anterior kidney and the posterior duodenum. Distal appendix measures approximately 10 mm. There is significant inflammation and periappendiceal fluid along the distal aspect of the appendix and focal perforation is not excluded. There is no evidence of discrete abscess or extraluminal air. Electronically Signed   By: Aletta Edouard M.D.   On: 07/20/2019 09:30    Review of Systems  Constitutional: Negative for chills and fever.  HENT: Negative for hearing loss.   Eyes: Negative for blurred vision.  Respiratory: Negative for cough and shortness of breath.   Cardiovascular: Negative for chest pain and palpitations.  Gastrointestinal: Positive for abdominal pain, constipation, nausea and vomiting. Negative for diarrhea.  Genitourinary: Negative for dysuria and urgency.  Musculoskeletal: Negative for myalgias and neck pain.  Skin: Negative for itching and rash.  Neurological: Negative for dizziness and headaches.    Blood pressure 125/84, pulse 60, temperature 98.5 F (36.9  C), temperature source Oral, resp. rate 16. Physical Exam  Constitutional: He is oriented to person, place, and time. He appears well-developed and well-nourished. No distress.  HENT:  Head: Normocephalic and atraumatic.  Eyes: Pupils are equal, round, and reactive to light. Conjunctivae are normal.  Neck: Normal range of motion. Neck supple.  Cardiovascular: Normal rate and regular rhythm.  Respiratory: Effort normal. No respiratory distress.  GI: Soft. There is abdominal tenderness (RLQ). There is no rebound.  Musculoskeletal: Normal range of motion.  Neurological: He is alert and oriented to person, place, and time.     Assessment/Plan 23 y.o. M with acute appendicitis.  Plan for operative intervention via laparoscopic appendectomy.  Patient is getting IV antibiotics now.  Risk of surgery include bleeding, pain, infection or abscess, damage to adjacent structures and hernia.  I have discussed this with the patient and answered all of his questions.  We will plan on proceeding to the operating room  as soon as possible.  Vanita PandaAlicia C Mikaele Stecher, MD 07/20/2019, 10:24 AM

## 2019-07-20 NOTE — Discharge Summary (Signed)
Physician Discharge Summary  Patient ID: Brandon Reynolds MRN: [id number] DOB/AGE: [birth year] 23 y.o.  Admit date: 07/20/2019 Discharge date: 07/20/2019  Admission Diagnoses: acute appendicitis  Discharge Diagnoses:  Active Problems:   Appendicitis   Acute appendicitis   Discharged Condition: good  Hospital Course: Patient discharged tolerating a diet and PO pain meds.  He was ambulating well and his pain was controlled with PO narcotics.    Consults: None  Significant Diagnostic Studies: labs: cbc, bmet and radiology: CT scan: Appendicitis  Treatments: antibiotics: ceftriaxone  Discharge Exam: Blood pressure (!) 122/95, pulse (!) 57, temperature 97.9 F (36.6 C), resp. rate 16, height 6\' 2"  (1.88 m), weight 83.9 kg, SpO2 97 %. General appearance: alert and cooperative GI: normal findings: soft, non-tender Incision/Wound: clea, dry, intact  Disposition: home    Allergies as of 07/20/2019   No Known Allergies     Medication List    TAKE these medications   HYDROcodone-acetaminophen 5-325 MG tablet Commonly known as: NORCO/VICODIN Take 1-2 tablets by mouth every 6 (six) hours as needed for moderate pain.   ibuprofen 200 MG tablet Commonly known as: ADVIL Take 600 mg by mouth every 6 (six) hours as needed for moderate pain.      Follow-up [facility], [place]. Schedule an appointment as soon as possible for a visit in 2 week(s).   Specialty: General Surgery Contact information: [address] [place] [phone]          Signed: Rosario Adie 4/56/2563, 5:45 PM

## 2019-07-20 NOTE — Anesthesia Postprocedure Evaluation (Signed)
Anesthesia Post Note  Patient: Brandon Reynolds  Procedure(s) Performed: APPENDECTOMY LAPAROSCOPIC (N/A Abdomen)     Patient location during evaluation: PACU Anesthesia Type: General Level of consciousness: awake and alert Pain management: pain level controlled Vital Signs Assessment: post-procedure vital signs reviewed and stable Respiratory status: spontaneous breathing, nonlabored ventilation, respiratory function stable and patient connected to nasal cannula oxygen Cardiovascular status: blood pressure returned to baseline and stable Postop Assessment: no apparent nausea or vomiting Anesthetic complications: no    Last Vitals:  Vitals:   07/20/19 1635 07/20/19 1726  BP: (!) 131/93 (!) 122/95  Pulse: (!) 59 (!) 57  Resp: 16 16  Temp: 36.4 C 36.6 C  SpO2: 98% 97%    Last Pain:  Vitals:   07/20/19 1638  TempSrc:   PainSc: 1                  Effie Berkshire

## 2019-07-21 ENCOUNTER — Encounter (HOSPITAL_COMMUNITY): Payer: Self-pay | Admitting: General Surgery

## 2020-07-27 IMAGING — CT CT ABDOMEN AND PELVIS WITH CONTRAST
2 of 4 series · 16 of 46 positions shown, 18 images · IV contrast (omnipaque)
Comparison: None.

CLINICAL DATA: Generalized abdominal pain worse on the right side
and vomiting.

EXAM:
CT ABDOMEN AND PELVIS WITH CONTRAST
TECHNIQUE: Multidetector CT imaging of the abdomen and pelvis was performed
using the standard protocol following bolus administration of
intravenous contrast.
CONTRAST:  100mL OMNIPAQUE IOHEXOL 300 MG/ML  SOLN

[Series 2: axial st · axial · 0.79mm/px · z∈[-502,-52]mm · 13 of 104 slices shown, 15 images]
[im 7/104  soft-tissue]
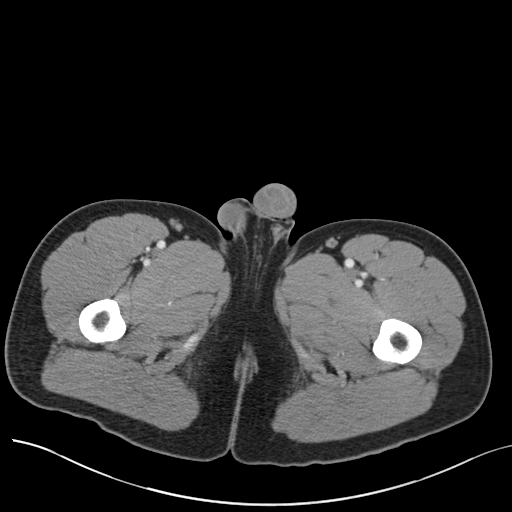
[im 7/104  bone]
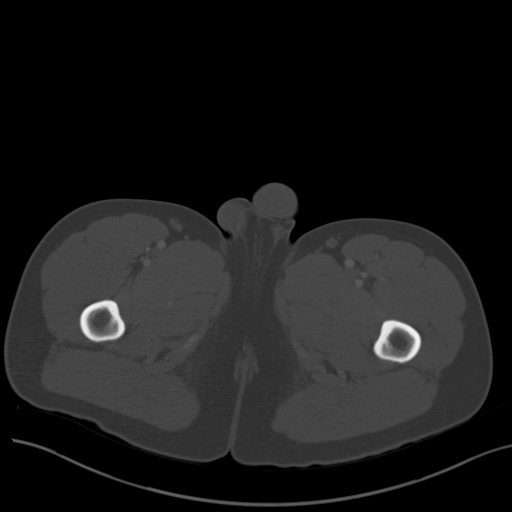
[im 13/104  soft-tissue]
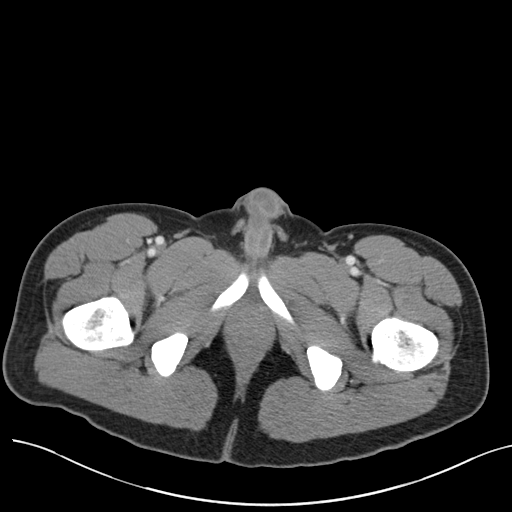
[im 25/104  soft-tissue]
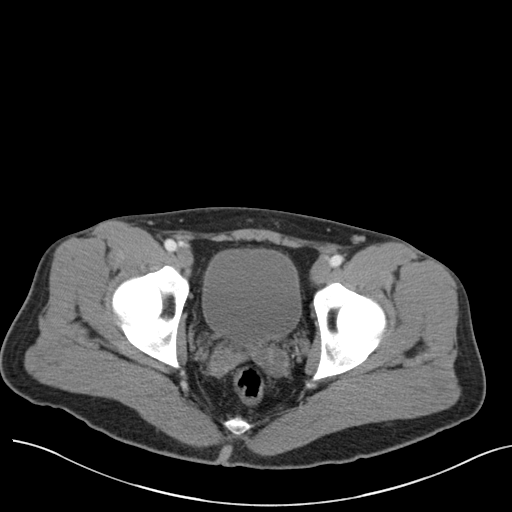
[im 31/104  soft-tissue]
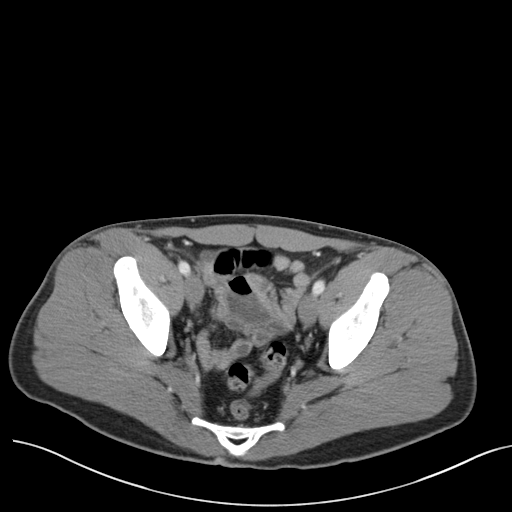
[im 37/104  soft-tissue]
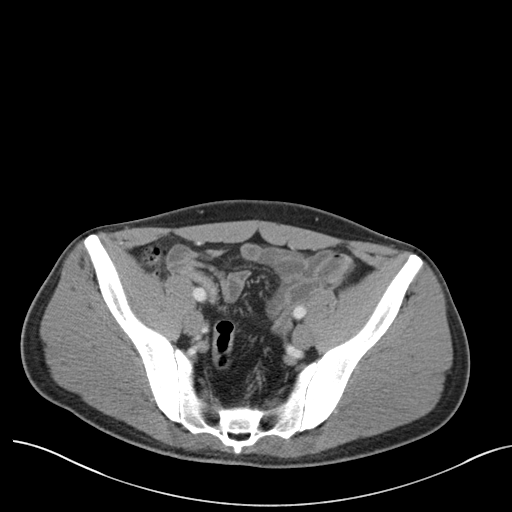
[im 43/104  soft-tissue]
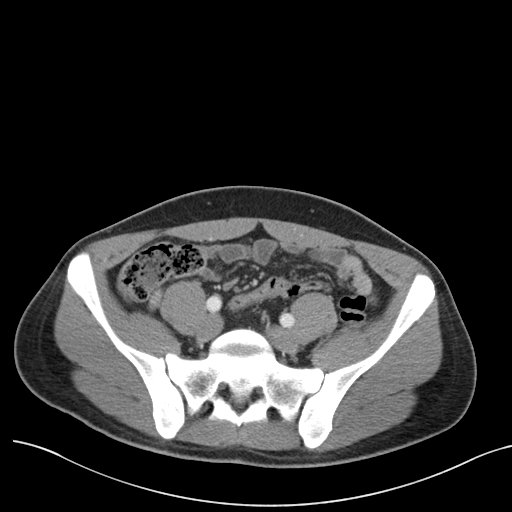
[im 55/104  soft-tissue]
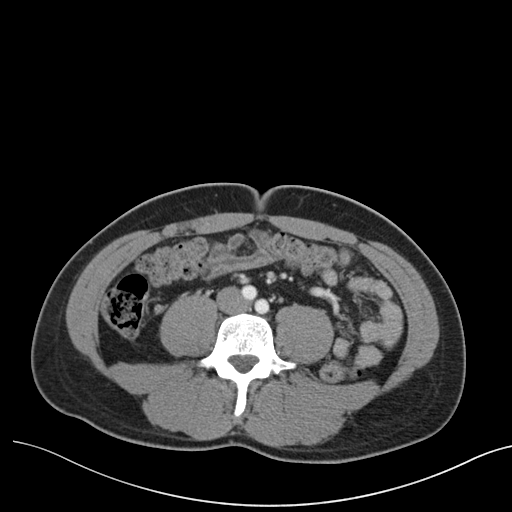
[im 61/104  soft-tissue]
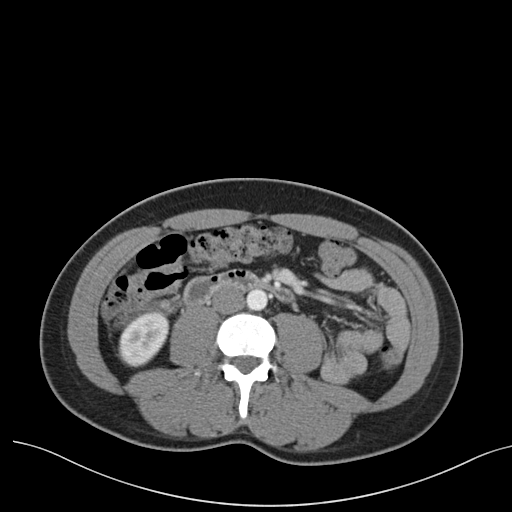
[im 67/104  soft-tissue]
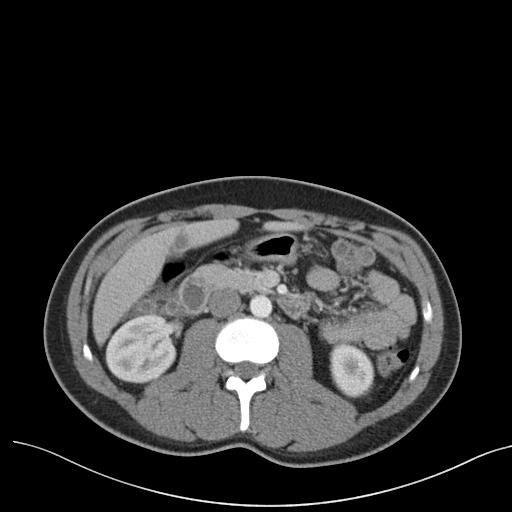
[im 67/104  bone]
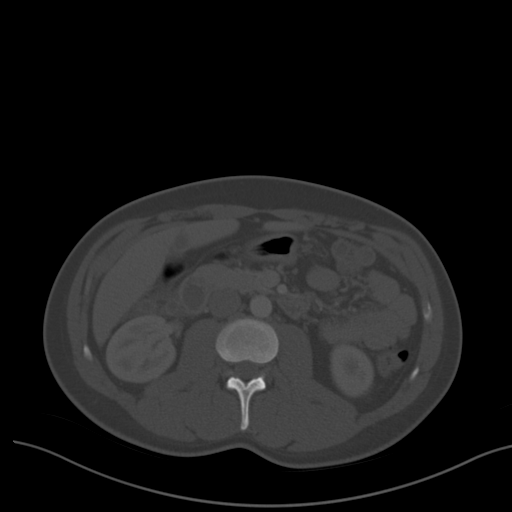
[im 73/104  soft-tissue]
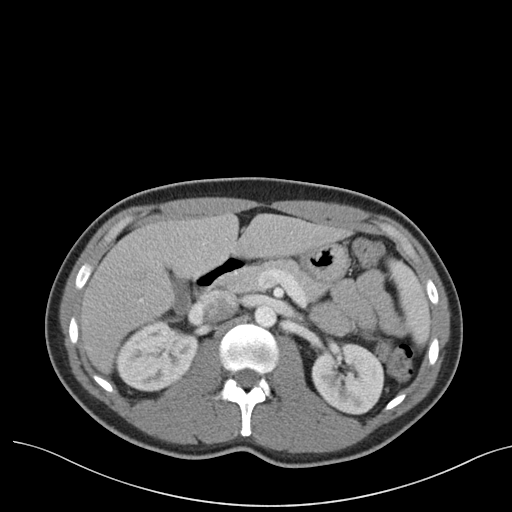
[im 79/104  soft-tissue]
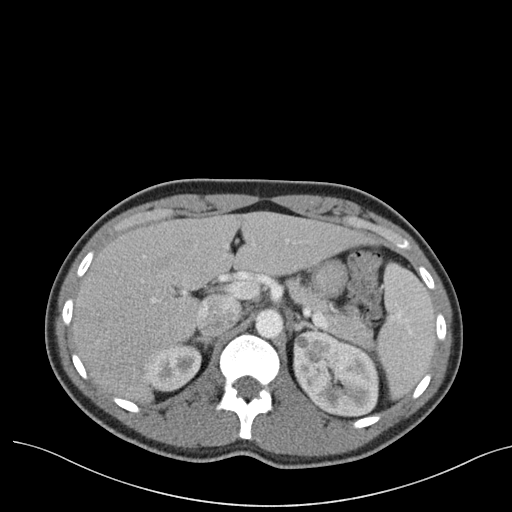
[im 91/104  soft-tissue]
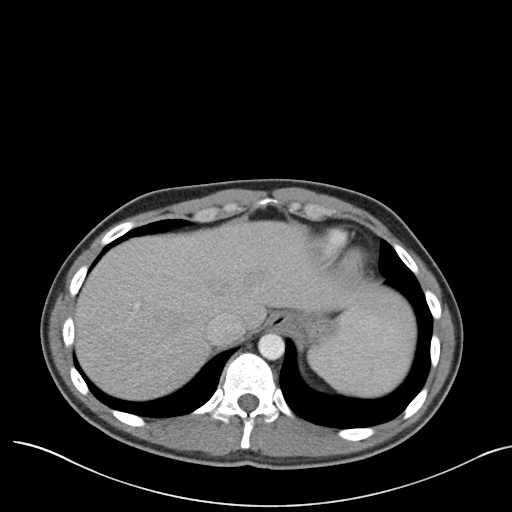
[im 97/104  soft-tissue]
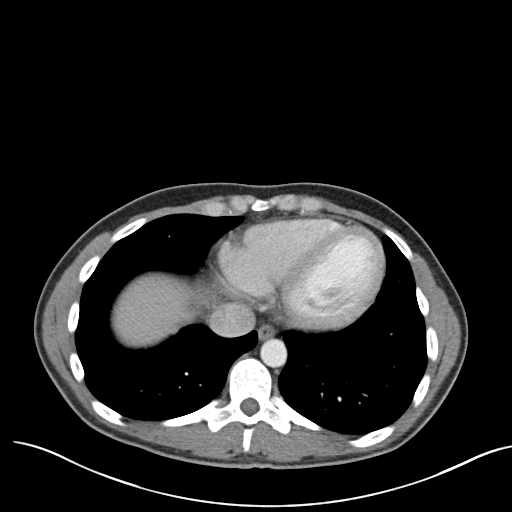

[Series 4: coronal st · coronal · 0.86mm/px · 3 of 113 slices shown]
[im 38/113  soft-tissue]
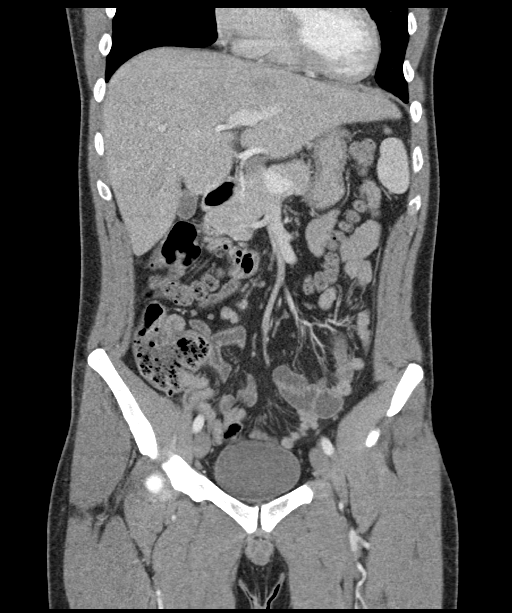
[im 50/113  soft-tissue]
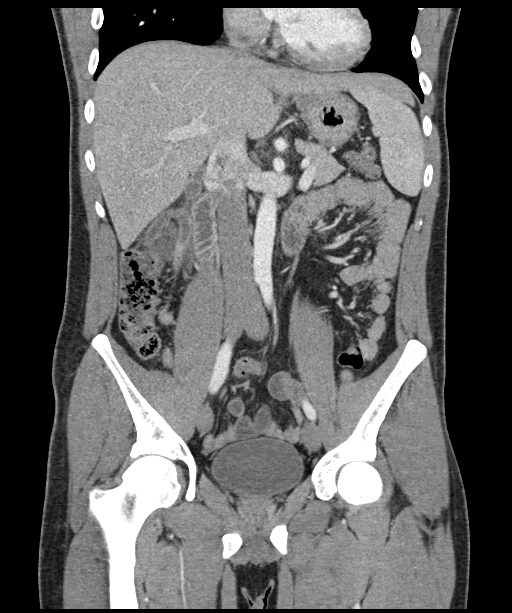
[im 63/113  soft-tissue]
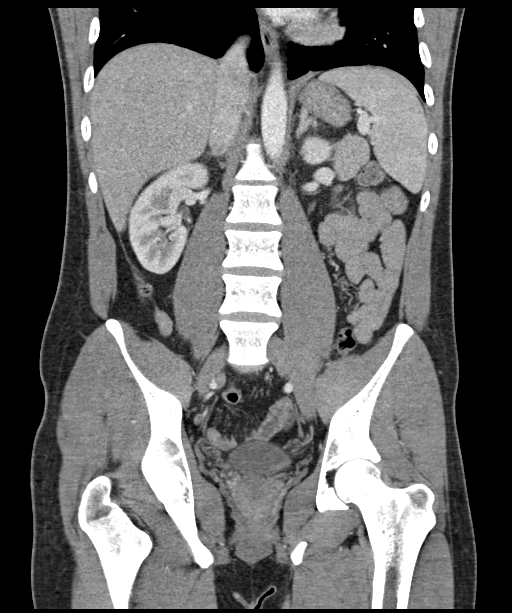

[16 of 46 positions shown; findings below may reference images not displayed]

FINDINGS: Lower chest: No acute abnormality.

Hepatobiliary: No focal liver abnormality is seen. No gallstones,
gallbladder wall thickening, or biliary dilatation.

Pancreas: Unremarkable. No pancreatic ductal dilatation or
surrounding inflammatory changes.

Spleen: Normal in size without focal abnormality.

Adrenals/Urinary Tract: Adrenal glands are unremarkable. Kidneys are
normal, without renal calculi, focal lesion, or hydronephrosis.
Bladder is unremarkable.

Stomach/Bowel: There is an elongated inflamed structure that
emanates likely off of the distal cecum and ascends superiorly up
towards the liver, nearly contacting the undersurface of the liver.
The structure is candy cane in shape and the distal portion is
dilated and inflamed, measuring up to 10 mm. There is significant
edema surrounding the distal portion of the structure with some
probable surrounding focal fluid but no discrete abscess or
extraluminal air. Findings are most suggestive of acute
appendicitis. An inflamed Meckel's diverticulum is felt to be less
likely. Inflammation surrounding the structure also abuts the
anterior aspect of the right mid to lower kidney and the posterior
right duodenum.

Vascular/Lymphatic: No significant vascular findings are present. No
enlarged abdominal or pelvic lymph nodes.

Reproductive: Prostate is unremarkable.

Other: No abdominal wall hernia or abnormality. No abdominopelvic
ascites.

Musculoskeletal: No acute or significant osseous findings.
IMPRESSION: Findings most likely consistent with acute appendicitis with a very
elongated ascending appendix present extending up to the
undersurface of the liver, abutting the anterior kidney and the
posterior duodenum. Distal appendix measures approximately 10 mm.
There is significant inflammation and periappendiceal fluid along
the distal aspect of the appendix and focal perforation is not
excluded. There is no evidence of discrete abscess or extraluminal
air.
